# Patient Record
Sex: Female | Born: 1985 | Race: Asian | Hispanic: No | Marital: Married | Smoking: Never smoker
Health system: Southern US, Community
[De-identification: ages and names within clinical notes are randomized; demographics above are authoritative.]

---

## 2019-04-05 ENCOUNTER — Emergency Department (HOSPITAL_BASED_OUTPATIENT_CLINIC_OR_DEPARTMENT_OTHER): Payer: 59

## 2019-04-05 ENCOUNTER — Emergency Department (HOSPITAL_BASED_OUTPATIENT_CLINIC_OR_DEPARTMENT_OTHER)
Admission: EM | Admit: 2019-04-05 | Discharge: 2019-04-05 | Disposition: A | Discharge status: Home / Self Care | Payer: 59 | Attending: Emergency Medicine | Admitting: Emergency Medicine

## 2019-04-05 ENCOUNTER — Other Ambulatory Visit: Payer: Self-pay

## 2019-04-05 ENCOUNTER — Encounter (HOSPITAL_BASED_OUTPATIENT_CLINIC_OR_DEPARTMENT_OTHER): Payer: Self-pay

## 2019-04-05 DIAGNOSIS — R1032 Left lower quadrant pain: Secondary | ICD-10-CM

## 2019-04-05 DIAGNOSIS — R350 Frequency of micturition: Secondary | ICD-10-CM | POA: Diagnosis not present

## 2019-04-05 LAB — CBC WITH DIFFERENTIAL/PLATELET
Abs Immature Granulocytes: 0.01 10*3/uL (ref 0.00–0.07)
Basophils Absolute: 0 10*3/uL (ref 0.0–0.1)
Basophils Relative: 0 %
Eosinophils Absolute: 0.2 10*3/uL (ref 0.0–0.5)
Eosinophils Relative: 3 %
HCT: 41.2 % (ref 36.0–46.0)
Hemoglobin: 13.2 g/dL (ref 12.0–15.0)
Immature Granulocytes: 0 %
Lymphocytes Relative: 26 %
Lymphs Abs: 1.8 10*3/uL (ref 0.7–4.0)
MCH: 27.8 pg (ref 26.0–34.0)
MCHC: 32 g/dL (ref 30.0–36.0)
MCV: 86.9 fL (ref 80.0–100.0)
Monocytes Absolute: 0.6 10*3/uL (ref 0.1–1.0)
Monocytes Relative: 9 %
Neutro Abs: 4.2 10*3/uL (ref 1.7–7.7)
Neutrophils Relative %: 62 %
Platelets: 183 10*3/uL (ref 150–400)
RBC: 4.74 MIL/uL (ref 3.87–5.11)
RDW: 13 % (ref 11.5–15.5)
WBC: 6.8 10*3/uL (ref 4.0–10.5)
nRBC: 0 % (ref 0.0–0.2)

## 2019-04-05 LAB — WET PREP, GENITAL
Clue Cells Wet Prep HPF POC: NONE SEEN
Sperm: NONE SEEN
Trich, Wet Prep: NONE SEEN
WBC, Wet Prep HPF POC: NONE SEEN
Yeast Wet Prep HPF POC: NONE SEEN

## 2019-04-05 LAB — URINALYSIS, MICROSCOPIC (REFLEX)

## 2019-04-05 LAB — BASIC METABOLIC PANEL
Anion gap: 8 (ref 5–15)
BUN: 9 mg/dL (ref 6–20)
CO2: 24 mmol/L (ref 22–32)
Calcium: 9.3 mg/dL (ref 8.9–10.3)
Chloride: 104 mmol/L (ref 98–111)
Creatinine, Ser: 0.56 mg/dL (ref 0.44–1.00)
GFR calc Af Amer: >60 mL/min (ref >60)
GFR calc non Af Amer: >60 mL/min (ref >60)
Glucose, Bld: 91 mg/dL (ref 70–99)
Potassium: 3.7 mmol/L (ref 3.5–5.1)
Sodium: 136 mmol/L (ref 135–145)

## 2019-04-05 LAB — URINALYSIS, ROUTINE W REFLEX MICROSCOPIC
Bilirubin Urine: NEGATIVE
Glucose, UA: NEGATIVE mg/dL
Ketones, ur: NEGATIVE mg/dL
Leukocytes,Ua: NEGATIVE
Nitrite: NEGATIVE
Protein, ur: NEGATIVE mg/dL
Specific Gravity, Urine: 1.01 (ref 1.005–1.030)
pH: 7 (ref 5.0–8.0)

## 2019-04-05 LAB — PREGNANCY, URINE: Preg Test, Ur: NEGATIVE

## 2019-04-05 MED ORDER — KETOROLAC TROMETHAMINE 30 MG/ML IJ SOLN
30.0000 mg | Freq: Once | INTRAMUSCULAR | Status: AC
  Administered 2019-04-05: 30 mg via INTRAVENOUS
  Filled 2019-04-05: qty 1

## 2019-04-05 MED ORDER — ONDANSETRON HCL 4 MG/2ML IJ SOLN
4.0000 mg | Freq: Once | INTRAMUSCULAR | Status: AC
  Administered 2019-04-05: 19:00:00 4 mg via INTRAVENOUS
  Filled 2019-04-05: qty 2

## 2019-04-05 NOTE — ED Notes (Signed)
Pt requested update. RN sent provider a secure message informing them of request.

## 2019-04-05 NOTE — ED Triage Notes (Signed)
Pt states beginning this afternoon having pain with urination and left flank pain.  Also urine frequency. Denies fever, denies n/v

## 2019-04-05 NOTE — Discharge Instructions (Signed)
As we discussed today, your CT was reassuring.  Your pelvic exam was reassuring also.  You have been tested for gonorrhea/committee.  Those results do not come back for 2 days.  If there is any abnormalities, you will be notified.  You can take Tylenol or Ibuprofen as directed for pain. You can alternate Tylenol and Ibuprofen every 4 hours. If you take Tylenol at 1pm, then you can take Ibuprofen at 5pm. Then you can take Tylenol again at 9pm.   As we discussed, you will need to follow-up with your OB/GYN.  Return to the emergency department for any worsening pain, fevers, vomiting or any other worsening or concerning symptoms.

## 2019-04-05 NOTE — ED Provider Notes (Signed)
[facility] EMERGENCY DEPARTMENT Provider Note   CSN: [id number] Arrival date & time: 04/05/19  1814    History   Chief Complaint Chief Complaint  Patient presents with   Flank Pain   Urinary Frequency    HPI Tamara Armstrong is a 33 y.o. female who presents for evaluation of left lower quadrant/flank pain that began approximately 1 PM this evening.  Patient reports that pain waxes and wanes in intensity.  She has not taken any medications.  She reports some dysuria.  She denies any hematuria.  Patient states that she had one episode of vomiting prior to coming to the ED.  Currently, she feels slightly nauseous.  Patient states that she has not noted any fever.  She reports that she was in her normal state of health today but thought for onset of symptoms.  Patient denies any chest pain, difficulty breathing, vaginal bleeding.  She reports her LMP was several weeks ago.  She reports that she had a similar pain several years ago in [place] but does not know what caused it.  She states she has never had a history of kidney stones.     The history is provided by the patient.    History reviewed. No pertinent past medical history.  There are no active problems to display for this patient.   History reviewed. No pertinent surgical history.   OB History   No obstetric history on file.      Home Medications    Prior to Admission medications   Not on File    Family History No family history on file.  Social History Social History   Tobacco Use   Smoking status: Never Smoker   Smokeless tobacco: Never Used  Substance Use Topics   Alcohol use: Never    Frequency: Never   Drug use: Never     Allergies   Patient has no known allergies.   Review of Systems Review of Systems  Constitutional: Negative for fever.  Respiratory: Negative for cough and shortness of breath.   Cardiovascular: Negative for chest pain.  Gastrointestinal: Positive for  abdominal pain, nausea and vomiting.  Genitourinary: Positive for dysuria and flank pain. Negative for hematuria and vaginal bleeding.  Neurological: Negative for headaches.  All other systems reviewed and are negative.    Physical Exam Updated Vital Signs BP 105/75 (BP Location: Right Arm)    Pulse (!) 59    Temp 98.7 F (37.1 C)    Resp 16    Ht 5\' 4"  (1.626 m)    Wt 70.3 kg    SpO2 100%    BMI 26.61 kg/m   Physical Exam Vitals signs and nursing note reviewed. Exam conducted with a chaperone present.  Constitutional:      Appearance: Normal appearance. She is well-developed.     Comments: Appears uncomfortable but no acute distress   HENT:     Head: Normocephalic and atraumatic.  Eyes:     General: Lids are normal.     Conjunctiva/sclera: Conjunctivae normal.     Pupils: Pupils are equal, round, and reactive to light.  Neck:     Musculoskeletal: Full passive range of motion without pain.  Cardiovascular:     Rate and Rhythm: Normal rate and regular rhythm.     Pulses: Normal pulses.     Heart sounds: Normal heart sounds. No murmur. No friction rub. No gallop.   Pulmonary:     Effort: Pulmonary effort is normal.  Breath sounds: Normal breath sounds.     Comments: Lungs clear to auscultation bilaterally.  Symmetric chest rise.  No wheezing, rales, rhonchi. Abdominal:     Palpations: Abdomen is soft. Abdomen is not rigid.     Tenderness: There is abdominal tenderness in the left lower quadrant. There is left CVA tenderness. There is no guarding.     Comments: Soft, nondistended.  Mild tenderness noted left CVA.  No tenderness noted at McBurney's point.  No rigidity, guarding.  Genitourinary:    Vagina: Normal.     Cervix: No cervical motion tenderness or discharge.     Adnexa: Right adnexa normal.       Right: No mass.         Left: Tenderness present. No mass.       Comments: The exam was performed with a chaperone present. Normal external female genitalia. No lesions,  rash, or sores.  No CMT.  Mild left adnexal tenderness.  No mass noted bilaterally. Musculoskeletal: Normal range of motion.  Skin:    General: Skin is warm and dry.     Capillary Refill: Capillary refill takes less than 2 seconds.  Neurological:     Mental Status: She is alert and oriented to person, place, and time.  Psychiatric:        Speech: Speech normal.      ED Treatments / Results  Labs (all labs ordered are listed, but only abnormal results are displayed) Labs Reviewed  URINALYSIS, ROUTINE W REFLEX MICROSCOPIC - Abnormal; Notable for the following components:      Result Value   Color, Urine STRAW (*)    Hgb urine dipstick TRACE (*)    All other components within normal limits  URINALYSIS, MICROSCOPIC (REFLEX) - Abnormal; Notable for the following components:   Bacteria, UA FEW (*)    All other components within normal limits  WET PREP, GENITAL  PREGNANCY, URINE  BASIC METABOLIC PANEL  CBC WITH DIFFERENTIAL/PLATELET  GC/CHLAMYDIA PROBE AMP ([place]) NOT AT [facility]    EKG None  Radiology Ct Renal Stone Study  Result Date: 04/05/2019 CLINICAL DATA:  Left flank pain EXAM: CT ABDOMEN AND PELVIS WITHOUT CONTRAST TECHNIQUE: Multidetector CT imaging of the abdomen and pelvis was performed following the standard protocol without IV contrast. COMPARISON:  None. FINDINGS: LOWER CHEST: There is no basilar pleural or apical pericardial effusion. HEPATOBILIARY: The hepatic contours and density are normal. There is no intra- or extrahepatic biliary dilatation. The gallbladder is normal. PANCREAS: The pancreatic parenchymal contours are normal and there is no ductal dilatation. There is no peripancreatic fluid collection. SPLEEN: Normal. ADRENALS/URINARY TRACT: --Adrenal glands: Normal. --Right kidney/ureter: No hydronephrosis, nephroureterolithiasis, perinephric stranding or solid renal mass. --Left kidney/ureter: No hydronephrosis, nephroureterolithiasis, perinephric stranding or  solid renal mass. --Urinary bladder: Normal for degree of distention STOMACH/BOWEL: --Stomach/Duodenum: There is no hiatal hernia or other gastric abnormality. The duodenal course and caliber are normal. --Small bowel: No dilatation or inflammation. --Colon: No focal abnormality. --Appendix: Normal. VASCULAR/LYMPHATIC: Normal course and caliber of the major abdominal vessels. No abdominal or pelvic lymphadenopathy. REPRODUCTIVE: Normal retroverted uterus. No adnexal mass. MUSCULOSKELETAL. No bony spinal canal stenosis or focal osseous abnormality. OTHER: None. IMPRESSION: No acute abdominopelvic abnormality. Electronically Signed   By: Deatra Robinson M.D.   On: 04/05/2019 20:00    Procedures Procedures (including critical care time)  Medications Ordered in ED Medications  ondansetron (ZOFRAN) injection 4 mg (4 mg Intravenous Given 04/05/19 1903)  ketorolac (TORADOL) 30 MG/ML injection 30  mg (30 mg Intravenous Given 04/05/19 1934)     Initial Impression / Assessment and Plan / ED Course  I have reviewed the triage vital signs and the nursing notes.  Pertinent labs & imaging results that were available during my care of the patient were reviewed by me and considered in my medical decision making (see chart for details).        33 year old female who presents for evaluation of left lower quadrant/left flank pain that began today approxi-1 PM.  Associated with nausea/vomiting.  No fevers.  No chest pain, difficulty breathing.  She states she had a similar pain several years ago in [place].  No kidney stone. Patient is afebrile, non-toxic appearing, sitting comfortably on examination table. Vital signs reviewed and stable.  Concern for UTI versus kidney stone.  Plan check labs, urine.  CBC shows no evidence of leukocytosis or anema.  BMP is unremarkable.  UA negative for infectious etiology but does show trace hemoglobin.  Given flank pain as well as trace hemoglobin, will plan for CT renal  study.  CT renal study shows no evidence of kidney stones. No evidence of adnexal mass.  Discussed results with patient.  Patient reports improvement after up of pain after analgesics.  She still reporting some mild discomfort. Her repeat exam is  Patient is resting comfortably on the examination table without any signs of distress.  Will plan for pelvic exam.   Pelvic exam as documented above.  Given reassuring CT scan as well as patient's improvement in pain, do not feel that further ultrasound imaging is warranted.  History/physical exam not concerning for ovarian torsion.  Patient able to tolerate p.o. in the department without any difficulty.  Repeat abdominal exam shows improved tenderness.  Patient states she feels comfortable going home.  I discussed with patient that this could be a recently ruptured ovarian cyst.  She has had a history of that before.  Encourage OB/GYN follow-up. At this time, patient exhibits no emergent life-threatening condition that require further evaluation in ED or admission.   Portions of this note were generated with Scientist, clinical (histocompatibility and immunogenetics)Dragon dictation software. Dictation errors may occur despite best attempts at proofreading.   Final Clinical Impressions(s) / ED Diagnoses   Final diagnoses:  Left lower quadrant abdominal pain    ED Discharge Orders    None       Rosana HoesLayden, Challen Spainhour A, PA-C 04/05/19 2237    Charlynne PanderYao, David Hsienta, MD 04/05/19 [phone]

## 2019-04-05 NOTE — ED Notes (Signed)
Patient transported to CT 

## 2019-04-05 NOTE — ED Notes (Signed)
ED Provider at bedside. 

## 2019-04-07 LAB — GC/CHLAMYDIA PROBE AMP (~~LOC~~) NOT AT ARMC
Chlamydia: NEGATIVE
Neisseria Gonorrhea: NEGATIVE

## 2020-03-29 IMAGING — CT CT RENAL STONE PROTOCOL
2 of 4 series · 17 of 46 positions shown, 19 images · non-contrast
Comparison: None.

CLINICAL DATA: Left flank pain

EXAM:
CT ABDOMEN AND PELVIS WITHOUT CONTRAST
TECHNIQUE: Multidetector CT imaging of the abdomen and pelvis was performed
following the standard protocol without IV contrast.

[Series 2: axial st · axial · 0.72mm/px · z∈[-421,-46]mm · 14 of 83 slices shown, 16 images]
[im 4/83  soft-tissue]
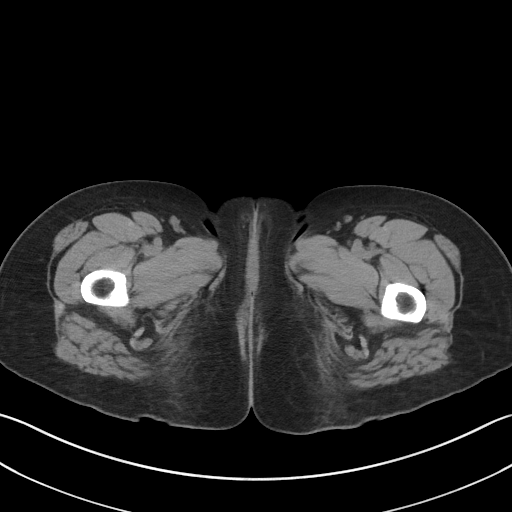
[im 4/83  bone]
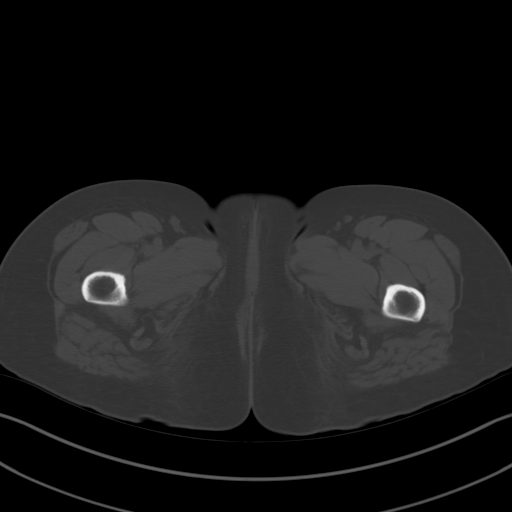
[im 10/83  soft-tissue]
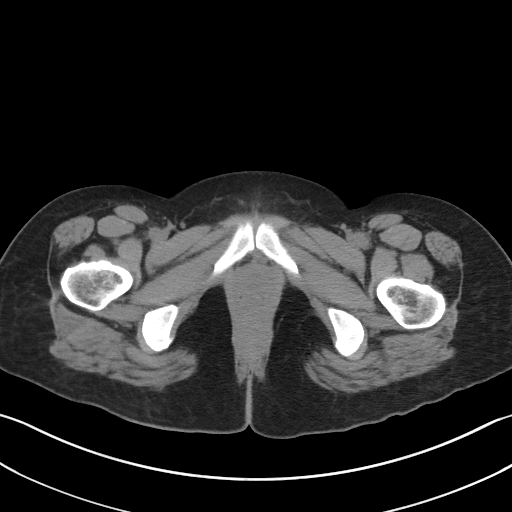
[im 16/83  soft-tissue]
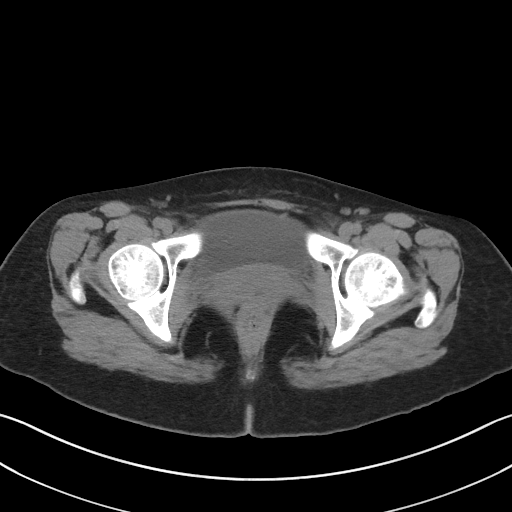
[im 22/83  soft-tissue]
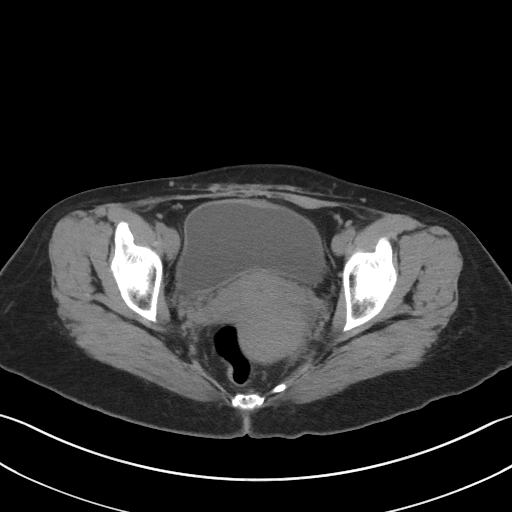
[im 28/83  soft-tissue]
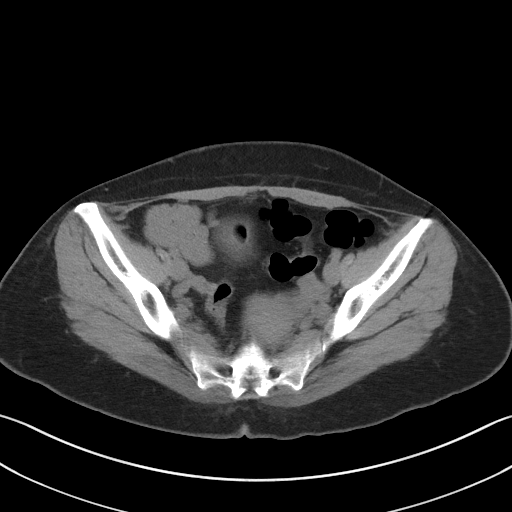
[im 34/83  soft-tissue]
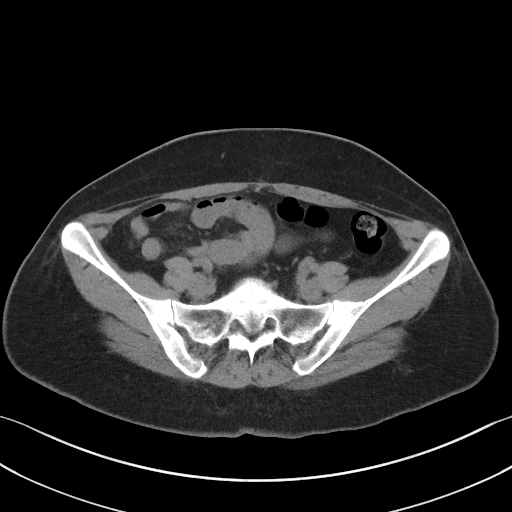
[im 40/83  soft-tissue]
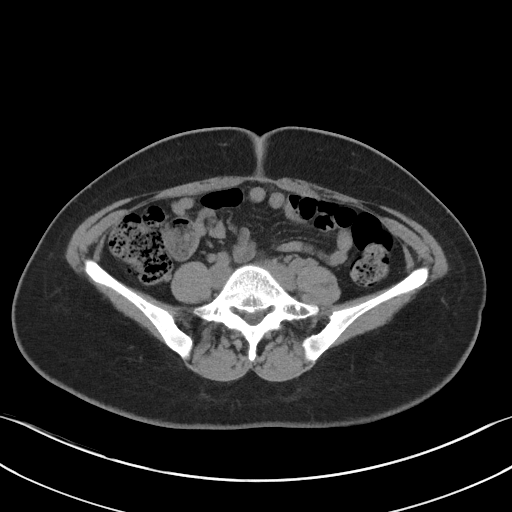
[im 43/83  soft-tissue]
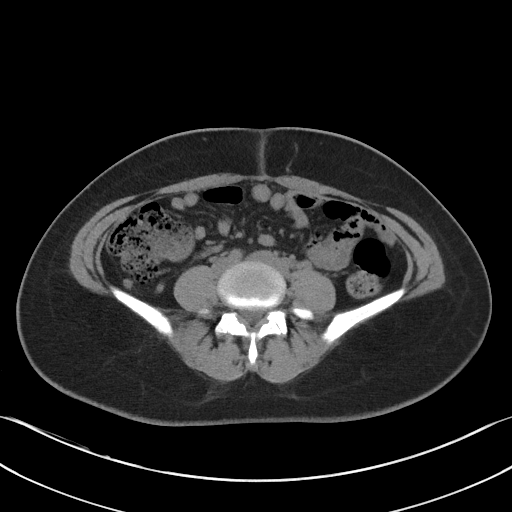
[im 49/83  soft-tissue]
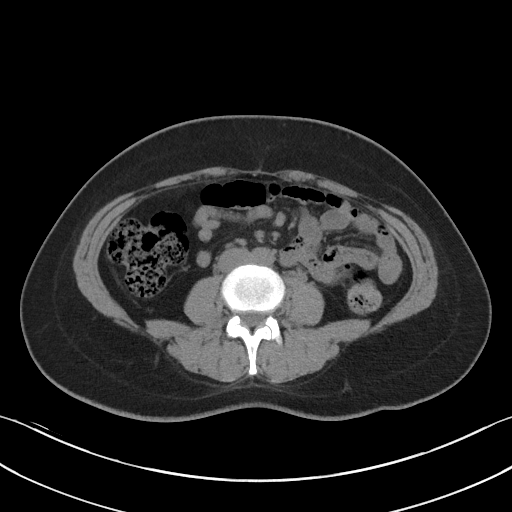
[im 49/83  bone]
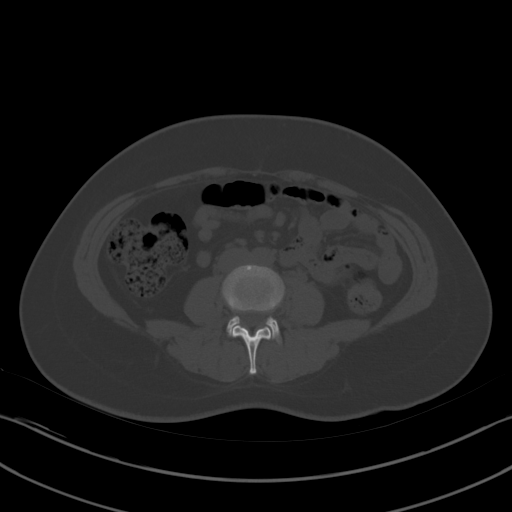
[im 55/83  soft-tissue]
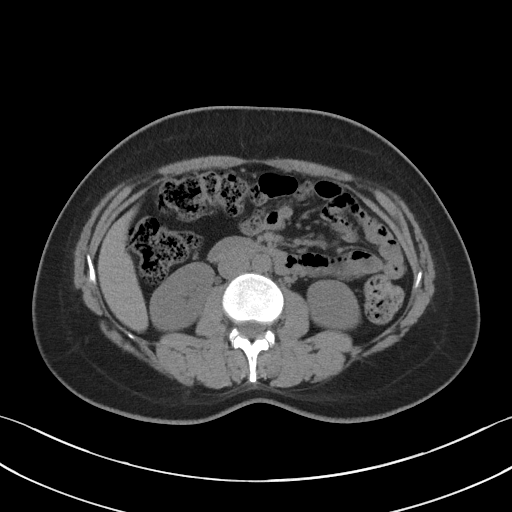
[im 61/83  soft-tissue]
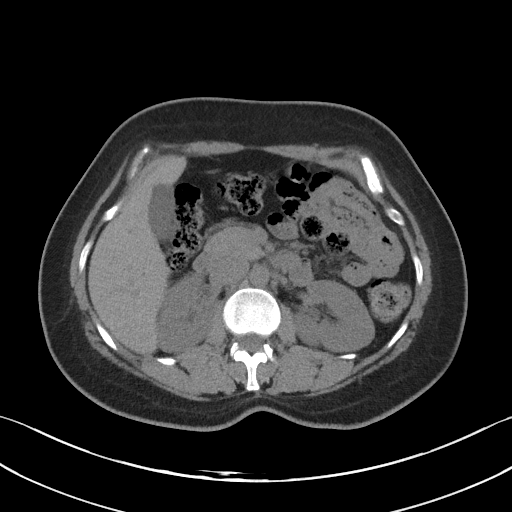
[im 67/83  soft-tissue]
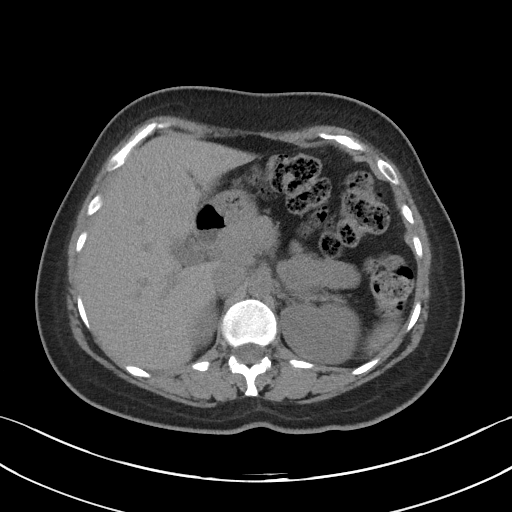
[im 73/83  soft-tissue]
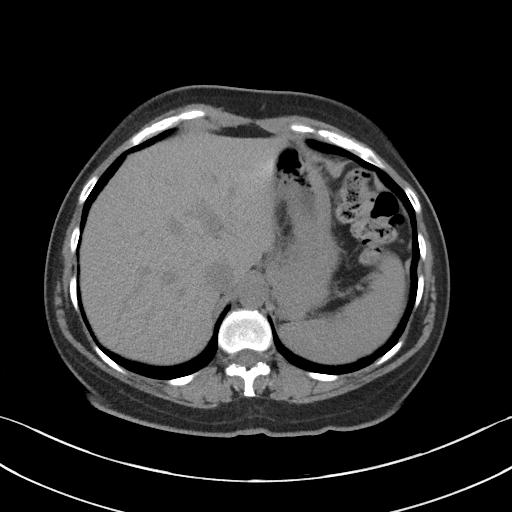
[im 79/83  soft-tissue]
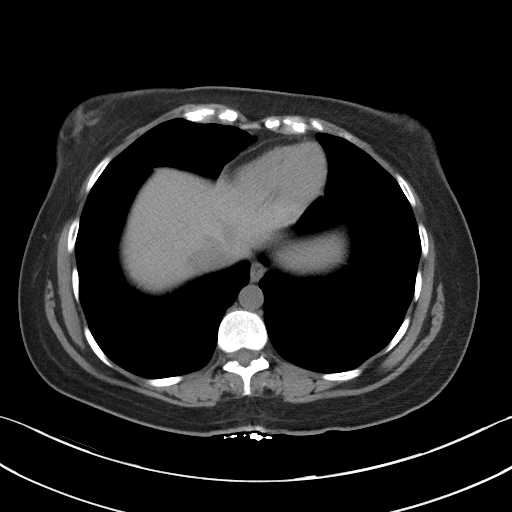

[Series 5: coronal st · coronal · 0.77mm/px · 3 of 86 slices shown]
[im 29/86  soft-tissue]
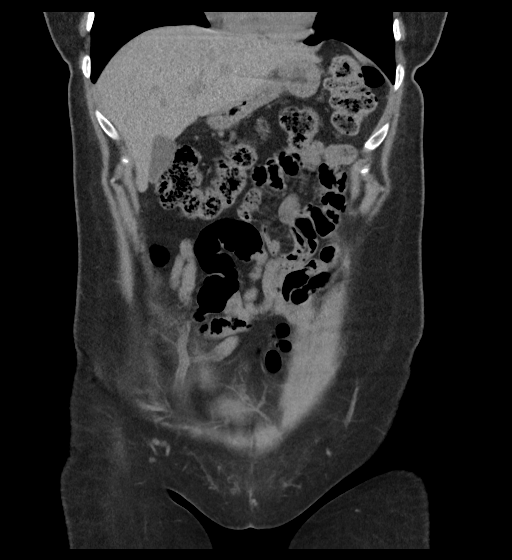
[im 38/86  soft-tissue]
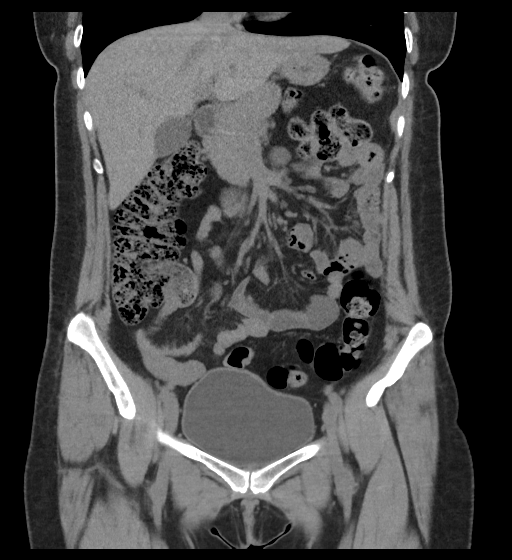
[im 48/86  soft-tissue]
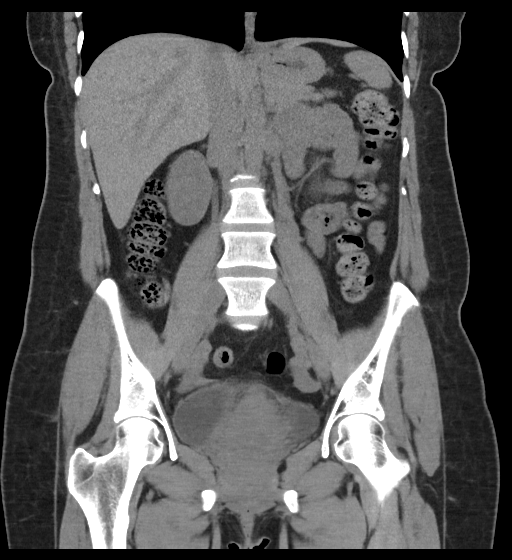

[17 of 46 positions shown; findings below may reference images not displayed]

FINDINGS: LOWER CHEST: There is no basilar pleural or apical pericardial
effusion.

HEPATOBILIARY: The hepatic contours and density are normal. There is
no intra- or extrahepatic biliary dilatation. The gallbladder is
normal.

PANCREAS: The pancreatic parenchymal contours are normal and there
is no ductal dilatation. There is no peripancreatic fluid
collection.

SPLEEN: Normal.

ADRENALS/URINARY TRACT:

--Adrenal glands: Normal.

--Right kidney/ureter: No hydronephrosis, nephroureterolithiasis,
perinephric stranding or solid renal mass.

--Left kidney/ureter: No hydronephrosis, nephroureterolithiasis,
perinephric stranding or solid renal mass.

--Urinary bladder: Normal for degree of distention

STOMACH/BOWEL:

--Stomach/Duodenum: There is no hiatal hernia or other gastric
abnormality. The duodenal course and caliber are normal.

--Small bowel: No dilatation or inflammation.

--Colon: No focal abnormality.

--Appendix: Normal.

VASCULAR/LYMPHATIC: Normal course and caliber of the major abdominal
vessels. No abdominal or pelvic lymphadenopathy.

REPRODUCTIVE: Normal retroverted uterus. No adnexal mass.

MUSCULOSKELETAL. No bony spinal canal stenosis or focal osseous
abnormality.

OTHER: None.
IMPRESSION: No acute abdominopelvic abnormality.

## 2020-10-19 ENCOUNTER — Other Ambulatory Visit: Payer: Self-pay | Admitting: Obstetrics and Gynecology

## 2020-10-19 DIAGNOSIS — N644 Mastodynia: Secondary | ICD-10-CM

## 2020-10-19 DIAGNOSIS — N649 Disorder of breast, unspecified: Secondary | ICD-10-CM

## 2020-11-16 ENCOUNTER — Ambulatory Visit: Payer: 59

## 2020-11-16 ENCOUNTER — Ambulatory Visit
Admission: RE | Admit: 2020-11-16 | Discharge: 2020-11-16 | Disposition: A | Discharge status: Home / Self Care | Payer: 59 | Source: Ambulatory Visit | Attending: Obstetrics and Gynecology | Admitting: Obstetrics and Gynecology

## 2020-11-16 ENCOUNTER — Other Ambulatory Visit: Payer: Self-pay

## 2020-11-16 DIAGNOSIS — N644 Mastodynia: Secondary | ICD-10-CM

## 2020-11-16 DIAGNOSIS — N649 Disorder of breast, unspecified: Secondary | ICD-10-CM

## 2021-08-13 ENCOUNTER — Emergency Department (HOSPITAL_BASED_OUTPATIENT_CLINIC_OR_DEPARTMENT_OTHER)
Admission: EM | Admit: 2021-08-13 | Discharge: 2021-08-13 | Disposition: A | Discharge status: Home / Self Care | Payer: 59 | Attending: Emergency Medicine | Admitting: Emergency Medicine

## 2021-08-13 ENCOUNTER — Encounter (HOSPITAL_BASED_OUTPATIENT_CLINIC_OR_DEPARTMENT_OTHER): Payer: Self-pay | Admitting: Emergency Medicine

## 2021-08-13 ENCOUNTER — Other Ambulatory Visit: Payer: Self-pay

## 2021-08-13 DIAGNOSIS — U071 COVID-19: Secondary | ICD-10-CM | POA: Diagnosis not present

## 2021-08-13 DIAGNOSIS — R509 Fever, unspecified: Secondary | ICD-10-CM | POA: Diagnosis present

## 2021-08-13 DIAGNOSIS — M7918 Myalgia, other site: Secondary | ICD-10-CM | POA: Diagnosis not present

## 2021-08-13 DIAGNOSIS — R059 Cough, unspecified: Secondary | ICD-10-CM

## 2021-08-13 DIAGNOSIS — R0981 Nasal congestion: Secondary | ICD-10-CM | POA: Insufficient documentation

## 2021-08-13 DIAGNOSIS — R000 Tachycardia, unspecified: Secondary | ICD-10-CM | POA: Insufficient documentation

## 2021-08-13 DIAGNOSIS — M791 Myalgia, unspecified site: Secondary | ICD-10-CM

## 2021-08-13 DIAGNOSIS — J029 Acute pharyngitis, unspecified: Secondary | ICD-10-CM

## 2021-08-13 LAB — RESP PANEL BY RT-PCR (FLU A&B, COVID) ARPGX2
Influenza A by PCR: NEGATIVE
Influenza B by PCR: NEGATIVE
SARS Coronavirus 2 by RT PCR: POSITIVE — AB

## 2021-08-13 LAB — GROUP A STREP BY PCR: Group A Strep by PCR: NOT DETECTED

## 2021-08-13 MED ORDER — PROCHLORPERAZINE MALEATE 10 MG PO TABS: 10.0000 mg | ORAL_TABLET | Freq: Two times a day (BID) | ORAL | 0 refills | Status: AC | PRN

## 2021-08-13 MED ORDER — PROCHLORPERAZINE EDISYLATE 10 MG/2ML IJ SOLN
10.0000 mg | Freq: Once | INTRAMUSCULAR | Status: AC
  Administered 2021-08-13: 10 mg via INTRAVENOUS
  Filled 2021-08-13: qty 2

## 2021-08-13 MED ORDER — SODIUM CHLORIDE 0.9 % IV BOLUS
1000.0000 mL | Freq: Once | INTRAVENOUS | Status: AC
  Administered 2021-08-13: 1000 mL via INTRAVENOUS

## 2021-08-13 MED ORDER — DIPHENHYDRAMINE HCL 50 MG/ML IJ SOLN
50.0000 mg | Freq: Once | INTRAMUSCULAR | Status: AC
  Administered 2021-08-13: 50 mg via INTRAVENOUS
  Filled 2021-08-13: qty 1

## 2021-08-13 NOTE — Discharge Instructions (Addendum)
Your history, exam, work-up today are consistent with COVID-19 infection.  Your headache improved after the medications and we suspect you are slightly dehydrated.  Please rest and stay hydrated and use the medication to help with headache and nausea.  Please follow-up with your primary doctor.  Please stay isolated.  If any symptoms change or worsen, please return to the nearest emergency department

## 2021-08-13 NOTE — ED Triage Notes (Signed)
Pt arrives pov with c/o generalized body aches, sore throat and fever x 2 days. Endorses tylenol at 1350 with no relief

## 2021-08-13 NOTE — ED Provider Notes (Signed)
[facility] EMERGENCY DEPARTMENT Provider Note   CSN: [id number] Arrival date & time: 08/13/21  1517     History Chief Complaint  Patient presents with   Fever    Almer Giddens is a 35 y.o. female.  The history is provided by the patient. No language interpreter was used.  Fever Max temp prior to arrival:  103.5 Temp source:  Oral Severity:  Moderate Onset quality:  Gradual Duration:  4 days Timing:  Constant Progression:  Waxing and waning Chronicity:  New Relieved by:  Acetaminophen Worsened by:  Nothing Ineffective treatments:  None tried Associated symptoms: chills, congestion, cough, headaches, myalgias, nausea and rhinorrhea   Associated symptoms: no chest pain, no confusion, no diarrhea, no dysuria, no rash and no vomiting   Risk factors: recent travel       No past medical history on file.  There are no problems to display for this patient.   Past Surgical History:  Procedure Laterality Date   CESAREAN SECTION       OB History   No obstetric history on file.     No family history on file.  Social History   Tobacco Use   Smoking status: Never   Smokeless tobacco: Never  Substance Use Topics   Alcohol use: Never   Drug use: Never    Home Medications Prior to Admission medications   Not on File    Allergies    Patient has no known allergies.  Review of Systems   Review of Systems  Constitutional:  Positive for chills, fatigue and fever. Negative for diaphoresis.  HENT:  Positive for congestion and rhinorrhea.   Eyes:  Positive for photophobia. Negative for visual disturbance.  Respiratory:  Positive for cough. Negative for chest tightness, shortness of breath and wheezing.   Cardiovascular:  Negative for chest pain, palpitations and leg swelling.  Gastrointestinal:  Positive for nausea. Negative for abdominal pain, constipation, diarrhea and vomiting.  Genitourinary:  Negative for dysuria and flank pain.   Musculoskeletal:  Positive for myalgias. Negative for back pain, neck pain and neck stiffness.  Skin:  Negative for rash and wound.  Neurological:  Positive for headaches. Negative for dizziness, facial asymmetry, speech difficulty, weakness and light-headedness.  Psychiatric/Behavioral:  Negative for agitation and confusion.   All other systems reviewed and are negative.  Physical Exam Updated Vital Signs BP 99/71 (BP Location: Right Arm)   Pulse (!) 119   Temp 99.7 F (37.6 C) (Oral)   Resp 20   Ht 5' 4.57" (1.64 m)   Wt 62.4 kg   LMP 07/24/2021   SpO2 98%   BMI 23.20 kg/m   Physical Exam Vitals and nursing note reviewed.  Constitutional:      General: She is not in acute distress.    Appearance: She is well-developed. She is not ill-appearing, toxic-appearing or diaphoretic.  HENT:     Head: Normocephalic and atraumatic.     Nose: Congestion present. No rhinorrhea.     Mouth/Throat:     Mouth: Mucous membranes are dry.     Pharynx: No oropharyngeal exudate or posterior oropharyngeal erythema.  Eyes:     Extraocular Movements: Extraocular movements intact.     Conjunctiva/sclera: Conjunctivae normal.     Pupils: Pupils are equal, round, and reactive to light.  Cardiovascular:     Rate and Rhythm: Regular rhythm. Tachycardia present.     Heart sounds: No murmur heard. Pulmonary:     Effort: Pulmonary effort is normal. No  respiratory distress.     Breath sounds: Normal breath sounds. No wheezing, rhonchi or rales.  Chest:     Chest wall: No tenderness.  Abdominal:     General: Abdomen is flat.     Palpations: Abdomen is soft.     Tenderness: There is no abdominal tenderness. There is no right CVA tenderness, guarding or rebound.  Musculoskeletal:        General: No tenderness.     Cervical back: Neck supple. No tenderness.     Right lower leg: No edema.     Left lower leg: No edema.  Skin:    General: Skin is warm and dry.     Capillary Refill: Capillary refill  takes less than 2 seconds.     Findings: No erythema.  Neurological:     General: No focal deficit present.     Mental Status: She is alert and oriented to person, place, and time. Mental status is at baseline.     Cranial Nerves: No cranial nerve deficit.     Sensory: No sensory deficit.     Motor: No weakness.  Psychiatric:        Mood and Affect: Mood normal.    ED Results / Procedures / Treatments   Labs (all labs ordered are listed, but only abnormal results are displayed) Labs Reviewed  RESP PANEL BY RT-PCR (FLU A&B, COVID) ARPGX2 - Abnormal; Notable for the following components:      Result Value   SARS Coronavirus 2 by RT PCR POSITIVE (*)    All other components within normal limits  GROUP A STREP BY PCR    EKG None  Radiology No results found.  Procedures Procedures   Medications Ordered in ED Medications  sodium chloride 0.9 % bolus 1,000 mL (0 mLs Intravenous Stopped 08/13/21 1740)  prochlorperazine (COMPAZINE) injection 10 mg (10 mg Intravenous Given 08/13/21 1639)  diphenhydrAMINE (BENADRYL) injection 50 mg (50 mg Intravenous Given 08/13/21 1636)    ED Course  I have reviewed the triage vital signs and the nursing notes.  Pertinent labs & imaging results that were available during my care of the patient were reviewed by me and considered in my medical decision making (see chart for details).    MDM Rules/Calculators/A&P                           Belma Blue is a 35 y.o. female with no significant past medical history who presents with several days of fevers, chills, congestion, sore throat, dry cough, myalgias, fatigue, nausea, and headaches.  Patient says that she recently drove to [place] for work and started having some symptoms around 4 days ago.  She reports that her son has had similar fever but it has resolved.  She reports she is having some dry cough, significant sore throat, and the myalgias and malaise and fatigue.  She reports she  has had fevers up to 103.5 earlier today.  She denies any neck pain or neck stiffness but does report photophobia with her headache.  No focal neurologic deficits reported.  She reports some nausea but no vomiting.  It has been years since her last significant headache.  She denies trauma.  She denies any constipation, diarrhea, or urinary changes.  She feels dehydrated and thirsty.  She reports she is both vaccinated and boosted against COVID and denies any known exposures.  On exam, lungs clear and chest nontender.  Abdomen nontender.  Patient moving all extremities.  Normal sensation and strength in extremities.  Pupils are symmetric and reactive normal extract movements.  She is photophobic.  Neck is nontender and no nuchal rigidity.  Normal range of motion.  No flank or back tenderness.  No rash seen.  Patient did not report any tick exposures.  Clinically I suspect patient has either flu, COVID, or other viral illness.  Given the sore throat, will check strep throat as well as the viral swab.  We will give a headache cocktail suspicious a migrainous type headache with dehydration and the photophobia.  We will give fluids, Compazine, and Benadryl.  With her exam showing no evidence of PTA or RPA, low suspicion for deep space neck infection, will hold on imaging or other labs at this time.  With lack of neck pain or neck stiffness, doubt meningitis as cause of her symptoms.  Anticipate reassessment for work-up but anticipate discharge home.  6:54 PM Strep test returned negative but COVID test returned positive.  Suspect this is the cause of all of her symptoms.  Patient was having improved headache after the fluids and headache cocktail.  We will give her prescription for Compazine for discharge.  She does not have comorbidities and otherwise is well-appearing, we had a discussion about Paxlovid and as her symptoms are approximately 42 days old and she does not have other comorbidities, we agreed  together to not provided at this time.  She will rest and stay hydrated and treat fevers at home.  She will follow-up with PCP.  Unit questions or concerns and was discharged with improving  symptoms.   Final Clinical Impression(s) / ED Diagnoses Final diagnoses:  COVID-19  Sore throat  Cough  Myalgia    Rx / DC Orders ED Discharge Orders          Ordered    prochlorperazine (COMPAZINE) 10 MG tablet  2 times daily PRN        08/13/21 1858            Clinical Impression: 1. COVID-19   2. Sore throat   3. Cough   4. Myalgia     Disposition: Discharge  Condition: Good  I have discussed the results, Dx and Tx plan with the pt(& family if present). He/she/they expressed understanding and agree(s) with the plan. Discharge instructions discussed at great length. Strict return precautions discussed and pt &/or family have verbalized understanding of the instructions. No further questions at time of discharge.    New Prescriptions   PROCHLORPERAZINE (COMPAZINE) 10 MG TABLET    Take 1 tablet (10 mg total) by mouth 2 (two) times daily as needed for nausea or vomiting.    Follow Up: [facility] [address] [place] [address] [phone] Schedule an appointment as soon as possible for a visit       Blaike Vickers, Canary Brim, MD 08/13/21 1859
